# Patient Record
Sex: Male | Born: 1950 | Race: White | Hispanic: No | Marital: Married | Smoking: Never smoker
Health system: Southern US, Community
[De-identification: ages and names within clinical notes are randomized; demographics above are authoritative.]

## PROBLEM LIST (undated history)

## (undated) DIAGNOSIS — N200 Calculus of kidney: Secondary | ICD-10-CM

## (undated) DIAGNOSIS — I10 Essential (primary) hypertension: Secondary | ICD-10-CM

## (undated) HISTORY — PX: PAIN PUMP IMPLANTATION: SHX330

## (undated) HISTORY — PX: ORIF CALCANEOUS FRACTURE: SHX5030

## (undated) HISTORY — PX: JOINT REPLACEMENT: SHX530

## (undated) HISTORY — PX: NASAL RECONSTRUCTION WITH SEPTAL REPAIR: SHX5665

---

## 2015-05-13 ENCOUNTER — Other Ambulatory Visit: Payer: Self-pay | Admitting: Neurosurgery

## 2015-05-13 ENCOUNTER — Other Ambulatory Visit (HOSPITAL_COMMUNITY): Payer: Self-pay | Admitting: Neurosurgery

## 2015-05-13 DIAGNOSIS — M5417 Radiculopathy, lumbosacral region: Secondary | ICD-10-CM

## 2015-05-14 ENCOUNTER — Ambulatory Visit (HOSPITAL_COMMUNITY)
Admission: RE | Admit: 2015-05-14 | Discharge: 2015-05-14 | Disposition: A | Discharge status: Home / Self Care | Payer: BLUE CROSS/BLUE SHIELD | Source: Ambulatory Visit | Attending: Neurosurgery | Admitting: Neurosurgery

## 2015-05-14 DIAGNOSIS — M5126 Other intervertebral disc displacement, lumbar region: Secondary | ICD-10-CM | POA: Insufficient documentation

## 2015-05-14 DIAGNOSIS — M5417 Radiculopathy, lumbosacral region: Secondary | ICD-10-CM

## 2015-05-14 DIAGNOSIS — M5137 Other intervertebral disc degeneration, lumbosacral region: Secondary | ICD-10-CM | POA: Insufficient documentation

## 2015-05-14 DIAGNOSIS — M4806 Spinal stenosis, lumbar region: Secondary | ICD-10-CM | POA: Diagnosis not present

## 2015-05-14 DIAGNOSIS — M47897 Other spondylosis, lumbosacral region: Secondary | ICD-10-CM | POA: Diagnosis not present

## 2015-05-14 DIAGNOSIS — N200 Calculus of kidney: Secondary | ICD-10-CM | POA: Diagnosis not present

## 2015-05-14 MED ORDER — IOHEXOL 300 MG/ML  SOLN
10.0000 mL | Freq: Once | INTRAMUSCULAR | Status: AC | PRN
  Administered 2015-05-14: 10 mL via INTRATHECAL

## 2015-05-14 MED ORDER — HYDROMORPHONE HCL 1 MG/ML IJ SOLN
INTRAMUSCULAR | Status: AC
  Filled 2015-05-14: qty 1

## 2015-05-14 MED ORDER — LIDOCAINE HCL (PF) 1 % IJ SOLN
INTRAMUSCULAR | Status: AC
  Filled 2015-05-14: qty 10

## 2015-05-14 MED ORDER — ONDANSETRON HCL 4 MG/2ML IJ SOLN: 4.0000 mg | Freq: Four times a day (QID) | INTRAMUSCULAR | Status: DC | PRN

## 2015-05-14 MED ORDER — HYDROMORPHONE HCL 1 MG/ML IJ SOLN
1.0000 mg | Freq: Once | INTRAMUSCULAR | Status: AC
Start: 2015-05-14 — End: 2015-05-14
  Administered 2015-05-14: 1 mg via INTRAMUSCULAR

## 2015-05-14 MED ORDER — DIAZEPAM 5 MG PO TABS
ORAL_TABLET | ORAL | Status: AC
  Administered 2015-05-14: 10 mg via ORAL
  Filled 2015-05-14: qty 2

## 2015-05-14 MED ORDER — DIAZEPAM 5 MG PO TABS
10.0000 mg | ORAL_TABLET | Freq: Once | ORAL | Status: AC
  Administered 2015-05-14: 10 mg via ORAL

## 2015-05-14 NOTE — Discharge Instructions (Signed)
Myelogram and Lumbar Puncture Discharge Instructions ° °1. Go home and rest quietly for the next 24 hours.  It is important to lie flat for the next 24 hours.  Get up only to go to the restroom.  You may lie in the bed or on a couch on your back, your stomach, your left side or your right side.  You may have one pillow under your head.  You may have pillows between your knees while you are on your side or under your knees while you are on your back. ° °2. DO NOT drive today.  Recline the seat as far back as it will go, while still wearing your seat belt, on the way home. ° °3. You may get up to go to the bathroom as needed.  You may sit up for 10 minutes to eat.  You may resume your normal diet and medications unless otherwise indicated. ° °4. The incidence of headache, nausea, or vomiting is about 5% (one in 20 patients).  If you develop a headache, lie flat and drink plenty of fluids until the headache goes away.  Caffeinated beverages may be helpful.  If you develop severe nausea and vomiting or a headache that does not go away with flat bed rest, call Dr Stern ° °5. You may resume normal activities after your 24 hours of bed rest is over; however, do not exert yourself strongly or do any heavy lifting tomorrow. ° °6. Call your physician for a follow-up appointment.  The results of your myelogram will be sent directly to your physician by the following day. ° °7. If you have any questions or if complications develop after you arrive home, please call Dr Stern ° °Discharge instructions have been explained to the patient.  The patient, or the person responsible for the patient, fully understands these instructions. ° ° °

## 2015-05-14 NOTE — Procedures (Signed)
Lumbar myelogram performed at L 5 S 1 level with Omnipaque 300, 6 cc

## 2015-05-18 ENCOUNTER — Other Ambulatory Visit: Payer: Self-pay | Admitting: Neurosurgery

## 2015-05-28 ENCOUNTER — Encounter (HOSPITAL_COMMUNITY): Payer: Self-pay

## 2015-05-28 ENCOUNTER — Encounter (HOSPITAL_COMMUNITY)
Admission: RE | Admit: 2015-05-28 | Discharge: 2015-05-28 | Disposition: A | Discharge status: Home / Self Care | Payer: BLUE CROSS/BLUE SHIELD | Source: Ambulatory Visit | Attending: Neurosurgery | Admitting: Neurosurgery

## 2015-05-28 DIAGNOSIS — Z01812 Encounter for preprocedural laboratory examination: Secondary | ICD-10-CM | POA: Diagnosis present

## 2015-05-28 DIAGNOSIS — M4316 Spondylolisthesis, lumbar region: Secondary | ICD-10-CM | POA: Insufficient documentation

## 2015-05-28 DIAGNOSIS — I10 Essential (primary) hypertension: Secondary | ICD-10-CM | POA: Diagnosis not present

## 2015-05-28 DIAGNOSIS — Z01810 Encounter for preprocedural cardiovascular examination: Secondary | ICD-10-CM | POA: Insufficient documentation

## 2015-05-28 DIAGNOSIS — M4806 Spinal stenosis, lumbar region: Secondary | ICD-10-CM | POA: Insufficient documentation

## 2015-05-28 HISTORY — DX: Essential (primary) hypertension: I10

## 2015-05-28 HISTORY — DX: Calculus of kidney: N20.0

## 2015-05-28 LAB — BASIC METABOLIC PANEL
Anion gap: 6 (ref 5–15)
BUN: 16 mg/dL (ref 6–20)
CHLORIDE: 103 mmol/L (ref 101–111)
CO2: 29 mmol/L (ref 22–32)
Calcium: 9.1 mg/dL (ref 8.9–10.3)
Creatinine, Ser: 1.12 mg/dL (ref 0.61–1.24)
GFR calc Af Amer: >60 mL/min (ref >60)
Glucose, Bld: 98 mg/dL (ref 65–99)
Potassium: 3.7 mmol/L (ref 3.5–5.1)
Sodium: 138 mmol/L (ref 135–145)

## 2015-05-28 LAB — CBC
HCT: 35.8 % — ABNORMAL LOW (ref 39.0–52.0)
Hemoglobin: 12.2 g/dL — ABNORMAL LOW (ref 13.0–17.0)
MCH: 32.1 pg (ref 26.0–34.0)
MCHC: 34.1 g/dL (ref 30.0–36.0)
MCV: 94.2 fL (ref 78.0–100.0)
PLATELETS: 237 10*3/uL (ref 150–400)
RBC: 3.8 MIL/uL — AB (ref 4.22–5.81)
RDW: 12.5 % (ref 11.5–15.5)
WBC: 5.7 10*3/uL (ref 4.0–10.5)

## 2015-05-28 LAB — SURGICAL PCR SCREEN
MRSA, PCR: NEGATIVE
Staphylococcus aureus: NEGATIVE

## 2015-05-28 NOTE — Pre-Procedure Instructions (Signed)
Bryker Madia  05/28/2015      [facility] - [place], [place] - [address] [place] [address] Phone: [phone] Fax: [phone]    Your procedure is scheduled on July 1  Report to [facility] Admitting at 715 A.M.  Call this number if you have problems the morning of surgery:  [phone]   Remember:  Do not eat food or drink liquids after midnight.  Take these medicines the morning of surgery with A SIP OF WATER Citalopram (Celexa)  Stop taking Aspirin, Ibuprofen, Aleve, BC's, Goody's, Herbal medication, Fish Oil   Do not wear jewelry, make-up or nail polish.  Do not wear lotions, powders, or perfumes.  You may wear deodorant.  Do not shave 48 hours prior to surgery.  Men may shave face and neck.  Do not bring valuables to the hospital.  [facility] is not responsible for any belongings or valuables.  Contacts, dentures or bridgework may not be worn into surgery.  Leave your suitcase in the car.  After surgery it may be brought to your room.  For patients admitted to the hospital, discharge time will be determined by your treatment team.  Patients discharged the day of surgery will not be allowed to drive home.    Special instructions:  [facility] - Preparing for Surgery  Before surgery, you can play an important role.  Because skin is not sterile, your skin needs to be as free of germs as possible.  You can reduce the number of germs on you skin by washing with CHG (chlorahexidine gluconate) soap before surgery.  CHG is an antiseptic cleaner which kills germs and bonds with the skin to continue killing germs even after washing.  Please DO NOT use if you have an allergy to CHG or antibacterial soaps.  If your skin becomes reddened/irritated stop using the CHG and inform your nurse when you arrive at Short Stay.  Do not shave (including legs and underarms) for at least 48 hours prior to the first CHG  shower.  You may shave your face.  Please follow these instructions carefully:   1.  Shower with CHG Soap the night before surgery and the    morning of Surgery.  2.  If you choose to wash your hair, wash your hair first as usual with your normal shampoo.  3.  After you shampoo, rinse your hair and body thoroughly to remove the   Shampoo.  4.  Use CHG as you would any other liquid soap.  You can apply chg directly       to the skin and wash gently with scrungie or a clean washcloth.  5.  Apply the CHG Soap to your body ONLY FROM THE NECK DOWN.    Do not use on open wounds or open sores.  Avoid contact with your eyes,   ears, mouth and genitals (private parts).  Wash genitals (private parts) with your normal soap.  6.  Wash thoroughly, paying special attention to the area where your surgery  will be performed.  7.  Thoroughly rinse your body with warm water from the neck down.  8.  DO NOT shower/wash with your normal soap after using and rinsing off  the CHG Soap.  9.  Pat yourself dry with a clean towel.            10.  Wear clean pajamas.  11.  Place clean sheets on your bed the night of your first shower and do not sleep with pets.  Day of Surgery  Do not apply any lotions/deoderants the morning of surgery.  Please wear clean clothes to the hospital/surgery center.     Please read over the following fact sheets that you were given. Pain Booklet, Coughing and Deep Breathing, MRSA Information and Surgical Site Infection Prevention

## 2015-05-28 NOTE — Progress Notes (Signed)
PCP is Dr Donnetta Hutching Denies seeing a cardiologist. Denies ever having a stress test, echo, or card cath.

## 2015-06-04 MED ORDER — CEFAZOLIN SODIUM-DEXTROSE 2-3 GM-% IV SOLR
2.0000 g | INTRAVENOUS | Status: AC
  Administered 2015-06-05: 2 g via INTRAVENOUS
  Filled 2015-06-04: qty 50

## 2015-06-04 NOTE — Progress Notes (Signed)
Confirmed with patient that Dr. Fredrich BirksStern's office had made him aware of time change for surgery.  Patient stated that he was aware that [facility]30 [facility].

## 2015-06-04 NOTE — Discharge Instructions (Signed)

## 2015-06-04 NOTE — H&P (Signed)
Patient ID:   [id number] Patient: Mason Horton  Date of Birth: [birth year] Visit Type: Chart Update   Date: 05/18/2015 10:00 AM Provider: Danae Orleans. Venetia Maxon MD   This 64 year old male presents for Leg pain.  History of Present Illness: 1.  Leg pain  The patient returns to review his lumbar myelogram and post myelographic CT scan which shows that he has severe spinal stenosis at L3 L4 and L4 L5 levels.  The patient's leg pain is likely a result of this.  He does have spondylolisthesis of L4 on L5 but this is not terribly severe and does not change with flexion and extension views of his lumbar spine which were obtained in the office today.  In light of his significant structural abnormalities have recommended proceeding with surgery.  He has severe and unrelenting pain into both his legs.  I called the [employer]  representative to discuss the issues of managing his intrathecal catheter which enters the lumbar spine at the L3 L4 level which is the site of planned surgery.  The options would be to remove this catheter which I think would be unfavorable as it will require repair CSF leak through a large to the needle hole in the dura versus avoiding contact with the catheter versus tying off the catheter and putting a new catheter in multilevel.  He does not feel that he needs the spinal cord stimulator but he does need the interim thecal pump and wants to have this continuing into the future.  Plan Robbi Garter be to proceed with lumbar decompression without fusion L3 through L5 levels in early July.  Risks and benefits were discussed in detail with the patient and he wishes to proceed.  We went over teaching materials as well.        PAST MEDICAL/SURGICAL HISTORY   (Detailed)  Disease/disorder Onset Date Management Date Comments    Spinal cord stimulator 2010     Left knee surgery 1993     Right knee surgery 2008   Anxiety      Hypertension         PAST MEDICAL HISTORY, SURGICAL HISTORY,  FAMILY HISTORY, SOCIAL HISTORY AND REVIEW OF SYSTEMS I have reviewed the patient's past medical, surgical, family and social history as well as the comprehensive review of systems as included on the [facility] history form dated 05/13/2015, which I have signed.  Family History  (Detailed) Relationship Family Member Name Deceased Age at Death Condition Onset Age Cause of Death      Family history of Hypertension  N      Family history of Cancer, prostate  N      Family history of Diabetes mellitus  N  Father    Cancer, brain  N    SOCIAL HISTORY  (Detailed) Tobacco use reviewed. Preferred language is Unknown.   Smoking status: Never smoker.  SMOKING STATUS Use Status Type Smoking Status Usage Per Day Years Used Total Pack Years  no/never  Never smoker             MEDICATIONS(added, continued or stopped this visit): Started Medication Directions Instruction Stopped   citalopram 40 mg tablet take 1 tablet by oral route  every day     lisinopril 20 mg tablet take 1 tablet by oral route  every day    05/18/2015 Norco 5 mg-325 mg tablet take 1 tablet by oral route  every 6 hours as needed for pain    05/18/2015 tizanidine 4  mg tablet take 1 tablet by oral route  up to TID prn spasm       ALLERGIES: Ingredient Reaction Medication Name Comment  NO KNOWN ALLERGIES     No known allergies. Reviewed, no changes.    Vitals Date Temp F BP Pulse Ht In Wt Lb BMI BSA Pain Score  05/18/2015  120/77 73 71 207 28.87  5/10      IMPRESSION Severe lumbar spinal stenosis with severe lower extremity radicular pain with intrathecal pump and spinal cord stimulator.  Completed Orders (this encounter) Order Details Reason Side Interpretation Result Initial Treatment Date Region  Lumbar Spine- AP/Lat/Flex/Ex      05/18/2015   Lifestyle education regarding diet Encouraged to eat a well balanced diet and follow up with primary care physician.          Assessment/Plan # Detail Type Description   1. Assessment Spinal stenosis of lumbar region (M48.06).       2. Assessment Spondylolisthesis, lumbar region (M43.16).       3. Assessment Low back pain, unspecified back pain laterality, with sciatica presence unspecified (M54.5).       4. Assessment Radiculopathy, lumbar region (M54.16).       5. Assessment Body mass index (BMI) 28.0-28.9, adult (Z61.09(Z68.28).   Plan Orders Today's instructions / counseling include(s) Lifestyle education regarding diet.         Pain Assessment/Treatment Pain Scale: 5/10. Method: Numeric Pain Intensity Scale. Location: leg. Onset: 09/06/2014. Duration: varies. Quality: discomforting. Pain Assessment/Treatment follow-up plan of care: Patient is taking medications as prescribed..  Decompression L3 through L5 levels with preservation of pump and stimulator as possible.  Orders: Diagnostic Procedures: Assessment Procedure  M48.06 Laminectomy - L3 - L4 - L5  M54.16 Lumbar Spine- AP/Lat/Flex/Ex  Instruction(s)/Education: Assessment Instruction  [phone] Lifestyle education regarding diet    MEDICATIONS PRESCRIBED TODAY    Rx Quantity Refills  TIZANIDINE HCL 4 mg  60 0  NORCO 5 mg-325 mg  60 0            Provider:  Danae OrleansJoseph D. Venetia MaxonStern MD  05/18/2015 05:14 PM Dictation edited by: Danae OrleansJoseph D. Venetia MaxonStern    CC Providers: Maeola HarmanJoseph Sonali Wivell MD [address] [place], [place] [id number]              Electronically signed by Danae OrleansJoseph D. Venetia MaxonStern MD on 05/18/2015 05:14 PM

## 2015-06-05 ENCOUNTER — Inpatient Hospital Stay (HOSPITAL_COMMUNITY)
Admission: RE | Admit: 2015-06-05 | Discharge: 2015-06-06 | DRG: 517 | Disposition: A | Discharge status: Home / Self Care | Payer: BLUE CROSS/BLUE SHIELD | Source: Ambulatory Visit | Attending: Neurosurgery | Admitting: Neurosurgery

## 2015-06-05 ENCOUNTER — Ambulatory Visit (HOSPITAL_COMMUNITY): Payer: BLUE CROSS/BLUE SHIELD | Admitting: Anesthesiology

## 2015-06-05 ENCOUNTER — Ambulatory Visit (HOSPITAL_COMMUNITY): Payer: BLUE CROSS/BLUE SHIELD

## 2015-06-05 ENCOUNTER — Encounter (HOSPITAL_COMMUNITY)
Admission: RE | Disposition: A | Discharge status: Home / Self Care | Payer: Self-pay | Source: Ambulatory Visit | Attending: Neurosurgery

## 2015-06-05 ENCOUNTER — Encounter (HOSPITAL_COMMUNITY): Payer: Self-pay | Admitting: *Deleted

## 2015-06-05 DIAGNOSIS — Z8249 Family history of ischemic heart disease and other diseases of the circulatory system: Secondary | ICD-10-CM

## 2015-06-05 DIAGNOSIS — M48062 Spinal stenosis, lumbar region with neurogenic claudication: Secondary | ICD-10-CM | POA: Diagnosis present

## 2015-06-05 DIAGNOSIS — F419 Anxiety disorder, unspecified: Secondary | ICD-10-CM | POA: Diagnosis present

## 2015-06-05 DIAGNOSIS — I10 Essential (primary) hypertension: Secondary | ICD-10-CM | POA: Diagnosis present

## 2015-06-05 DIAGNOSIS — Z9689 Presence of other specified functional implants: Secondary | ICD-10-CM

## 2015-06-05 DIAGNOSIS — Z79891 Long term (current) use of opiate analgesic: Secondary | ICD-10-CM

## 2015-06-05 DIAGNOSIS — Z419 Encounter for procedure for purposes other than remedying health state, unspecified: Secondary | ICD-10-CM

## 2015-06-05 DIAGNOSIS — M4806 Spinal stenosis, lumbar region: Principal | ICD-10-CM | POA: Diagnosis present

## 2015-06-05 HISTORY — PX: LUMBAR LAMINECTOMY/DECOMPRESSION MICRODISCECTOMY: SHX5026

## 2015-06-05 SURGERY — LUMBAR LAMINECTOMY/DECOMPRESSION MICRODISCECTOMY 2 LEVELS
Anesthesia: General | Site: Back

## 2015-06-05 MED ORDER — KETAMINE HCL 100 MG/ML IJ SOLN
INTRAMUSCULAR | Status: AC
  Administered 2015-06-05: 56 mg via INTRAVENOUS
  Filled 2015-06-05: qty 1

## 2015-06-05 MED ORDER — PHENYLEPHRINE 40 MCG/ML (10ML) SYRINGE FOR IV PUSH (FOR BLOOD PRESSURE SUPPORT)
PREFILLED_SYRINGE | INTRAVENOUS | Status: AC
  Filled 2015-06-05: qty 10

## 2015-06-05 MED ORDER — BISACODYL 10 MG RE SUPP: 10.0000 mg | Freq: Every day | RECTAL | Status: DC | PRN

## 2015-06-05 MED ORDER — NEOSTIGMINE METHYLSULFATE 10 MG/10ML IV SOLN
INTRAVENOUS | Status: DC | PRN
  Administered 2015-06-05: 3 mg via INTRAVENOUS

## 2015-06-05 MED ORDER — LIDOCAINE-EPINEPHRINE 1 %-1:100000 IJ SOLN
INTRAMUSCULAR | Status: DC | PRN
  Administered 2015-06-05: 1 mL
  Administered 2015-06-05: 9 mL

## 2015-06-05 MED ORDER — EPHEDRINE SULFATE 50 MG/ML IJ SOLN
INTRAMUSCULAR | Status: DC | PRN
  Administered 2015-06-05: 15 mg via INTRAVENOUS
  Administered 2015-06-05: 10 mg via INTRAVENOUS

## 2015-06-05 MED ORDER — ONDANSETRON HCL 4 MG/2ML IJ SOLN
INTRAMUSCULAR | Status: AC
  Filled 2015-06-05: qty 2

## 2015-06-05 MED ORDER — SODIUM CHLORIDE 0.9 % IJ SOLN
3.0000 mL | Freq: Two times a day (BID) | INTRAMUSCULAR | Status: DC
  Administered 2015-06-05: 3 mL via INTRAVENOUS

## 2015-06-05 MED ORDER — PROMETHAZINE HCL 25 MG/ML IJ SOLN: 6.2500 mg | INTRAMUSCULAR | Status: DC | PRN

## 2015-06-05 MED ORDER — THROMBIN 5000 UNITS EX SOLR
CUTANEOUS | Status: DC | PRN
  Administered 2015-06-05 (×2): 5000 [IU] via TOPICAL

## 2015-06-05 MED ORDER — MENTHOL 3 MG MT LOZG: 1.0000 | LOZENGE | OROMUCOSAL | Status: DC | PRN

## 2015-06-05 MED ORDER — HYDROCODONE-ACETAMINOPHEN 5-325 MG PO TABS
1.0000 | ORAL_TABLET | ORAL | Status: DC | PRN
  Administered 2015-06-05: 2 via ORAL
  Filled 2015-06-05: qty 2

## 2015-06-05 MED ORDER — SUCCINYLCHOLINE CHLORIDE 20 MG/ML IJ SOLN
INTRAMUSCULAR | Status: AC
  Filled 2015-06-05: qty 1

## 2015-06-05 MED ORDER — BUPIVACAINE HCL (PF) 0.5 % IJ SOLN
INTRAMUSCULAR | Status: DC | PRN
Start: 2015-06-05 — End: 2015-06-05
  Administered 2015-06-05: 1 mL
  Administered 2015-06-05: 9 mL

## 2015-06-05 MED ORDER — ONDANSETRON HCL 4 MG/2ML IJ SOLN
INTRAMUSCULAR | Status: DC | PRN
  Administered 2015-06-05: 4 mg via INTRAVENOUS

## 2015-06-05 MED ORDER — NEOSTIGMINE METHYLSULFATE 10 MG/10ML IV SOLN
INTRAVENOUS | Status: AC
  Filled 2015-06-05: qty 1

## 2015-06-05 MED ORDER — FENTANYL CITRATE (PF) 250 MCG/5ML IJ SOLN
INTRAMUSCULAR | Status: DC | PRN
  Administered 2015-06-05: 100 ug via INTRAVENOUS
  Administered 2015-06-05: 50 ug via INTRAVENOUS

## 2015-06-05 MED ORDER — FERROUS SULFATE 325 (65 FE) MG PO TABS
325.0000 mg | ORAL_TABLET | Freq: Every day | ORAL | Status: DC
  Administered 2015-06-06: 325 mg via ORAL
  Filled 2015-06-05 (×2): qty 1

## 2015-06-05 MED ORDER — MIDAZOLAM HCL 2 MG/2ML IJ SOLN
INTRAMUSCULAR | Status: AC
  Filled 2015-06-05: qty 2

## 2015-06-05 MED ORDER — FLEET ENEMA 7-19 GM/118ML RE ENEM
1.0000 | ENEMA | Freq: Once | RECTAL | Status: AC | PRN
  Filled 2015-06-05: qty 1

## 2015-06-05 MED ORDER — SODIUM CHLORIDE 0.9 % IV SOLN
500.0000 mg | INTRAVENOUS | Status: DC | PRN
  Administered 2015-06-05: 10 ug/kg/min via INTRAVENOUS

## 2015-06-05 MED ORDER — SODIUM CHLORIDE 0.9 % IJ SOLN
INTRAMUSCULAR | Status: AC
  Filled 2015-06-05: qty 10

## 2015-06-05 MED ORDER — OXYCODONE-ACETAMINOPHEN 5-325 MG PO TABS
ORAL_TABLET | ORAL | Status: AC
  Filled 2015-06-05: qty 2

## 2015-06-05 MED ORDER — HYDROMORPHONE HCL 1 MG/ML IJ SOLN
0.5000 mg | INTRAMUSCULAR | Status: DC | PRN
  Administered 2015-06-05 – 2015-06-06 (×4): 1 mg via INTRAVENOUS
  Filled 2015-06-05 (×4): qty 1

## 2015-06-05 MED ORDER — METHOCARBAMOL 1000 MG/10ML IJ SOLN
500.0000 mg | Freq: Four times a day (QID) | INTRAMUSCULAR | Status: DC | PRN
  Filled 2015-06-05: qty 5

## 2015-06-05 MED ORDER — GLYCOPYRROLATE 0.2 MG/ML IJ SOLN
INTRAMUSCULAR | Status: AC
  Filled 2015-06-05: qty 2

## 2015-06-05 MED ORDER — PANTOPRAZOLE SODIUM 40 MG PO TBEC
40.0000 mg | DELAYED_RELEASE_TABLET | Freq: Every day | ORAL | Status: DC
  Administered 2015-06-05: 40 mg via ORAL
  Filled 2015-06-05: qty 1

## 2015-06-05 MED ORDER — LIDOCAINE HCL (CARDIAC) 20 MG/ML IV SOLN
INTRAVENOUS | Status: DC | PRN
  Administered 2015-06-05: 60 mg via INTRAVENOUS

## 2015-06-05 MED ORDER — LACTATED RINGERS IV SOLN
INTRAVENOUS | Status: DC | PRN
  Administered 2015-06-05 (×2): via INTRAVENOUS

## 2015-06-05 MED ORDER — FENTANYL CITRATE (PF) 250 MCG/5ML IJ SOLN
INTRAMUSCULAR | Status: AC
  Filled 2015-06-05: qty 5

## 2015-06-05 MED ORDER — CITALOPRAM HYDROBROMIDE 20 MG PO TABS
20.0000 mg | ORAL_TABLET | Freq: Every day | ORAL | Status: DC
  Filled 2015-06-05: qty 1

## 2015-06-05 MED ORDER — PANTOPRAZOLE SODIUM 40 MG IV SOLR
40.0000 mg | Freq: Every day | INTRAVENOUS | Status: DC
  Filled 2015-06-05: qty 40

## 2015-06-05 MED ORDER — HYDROMORPHONE HCL 1 MG/ML IJ SOLN
0.2500 mg | INTRAMUSCULAR | Status: DC | PRN
  Administered 2015-06-05 (×4): 0.5 mg via INTRAVENOUS

## 2015-06-05 MED ORDER — ALUM & MAG HYDROXIDE-SIMETH 200-200-20 MG/5ML PO SUSP: 30.0000 mL | Freq: Four times a day (QID) | ORAL | Status: DC | PRN

## 2015-06-05 MED ORDER — VITAMIN B-1 50 MG PO TABS
50.0000 mg | ORAL_TABLET | Freq: Every day | ORAL | Status: DC
  Filled 2015-06-05 (×2): qty 1

## 2015-06-05 MED ORDER — PROPOFOL 10 MG/ML IV BOLUS
INTRAVENOUS | Status: DC | PRN
  Administered 2015-06-05: 140 mg via INTRAVENOUS
  Administered 2015-06-05: 40 mg via INTRAVENOUS

## 2015-06-05 MED ORDER — HYDROMORPHONE HCL 1 MG/ML IJ SOLN
INTRAMUSCULAR | Status: AC
  Administered 2015-06-05: 0.5 mg via INTRAVENOUS
  Filled 2015-06-05: qty 2

## 2015-06-05 MED ORDER — IRON PO TABS: 1.0000 | ORAL_TABLET | Freq: Every day | ORAL | Status: DC

## 2015-06-05 MED ORDER — THROMBIN 5000 UNITS EX SOLR
OROMUCOSAL | Status: DC | PRN
  Administered 2015-06-05: 09:00:00 via TOPICAL

## 2015-06-05 MED ORDER — ACETAMINOPHEN 325 MG PO TABS: 650.0000 mg | ORAL_TABLET | ORAL | Status: DC | PRN

## 2015-06-05 MED ORDER — OXYCODONE-ACETAMINOPHEN 5-325 MG PO TABS
1.0000 | ORAL_TABLET | ORAL | Status: DC | PRN
  Administered 2015-06-05 – 2015-06-06 (×4): 2 via ORAL
  Filled 2015-06-05 (×3): qty 2

## 2015-06-05 MED ORDER — PROPOFOL 10 MG/ML IV BOLUS
INTRAVENOUS | Status: AC
  Filled 2015-06-05: qty 20

## 2015-06-05 MED ORDER — METHOCARBAMOL 500 MG PO TABS
500.0000 mg | ORAL_TABLET | Freq: Four times a day (QID) | ORAL | Status: DC | PRN
  Administered 2015-06-05 – 2015-06-06 (×2): 500 mg via ORAL
  Filled 2015-06-05: qty 1

## 2015-06-05 MED ORDER — SODIUM CHLORIDE 0.9 % IJ SOLN: 3.0000 mL | INTRAMUSCULAR | Status: DC | PRN

## 2015-06-05 MED ORDER — ROCURONIUM BROMIDE 100 MG/10ML IV SOLN
INTRAVENOUS | Status: DC | PRN
  Administered 2015-06-05: 40 mg via INTRAVENOUS

## 2015-06-05 MED ORDER — POLYETHYLENE GLYCOL 3350 17 G PO PACK
17.0000 g | PACK | Freq: Every day | ORAL | Status: DC | PRN
  Filled 2015-06-05: qty 1

## 2015-06-05 MED ORDER — EPHEDRINE SULFATE 50 MG/ML IJ SOLN
INTRAMUSCULAR | Status: AC
  Filled 2015-06-05: qty 1

## 2015-06-05 MED ORDER — PHENOL 1.4 % MT LIQD: 1.0000 | OROMUCOSAL | Status: DC | PRN

## 2015-06-05 MED ORDER — METHOCARBAMOL 500 MG PO TABS
ORAL_TABLET | ORAL | Status: AC
  Filled 2015-06-05: qty 1

## 2015-06-05 MED ORDER — ROCURONIUM BROMIDE 50 MG/5ML IV SOLN
INTRAVENOUS | Status: AC
  Filled 2015-06-05: qty 1

## 2015-06-05 MED ORDER — ZOLPIDEM TARTRATE 5 MG PO TABS
10.0000 mg | ORAL_TABLET | Freq: Every evening | ORAL | Status: DC | PRN
  Administered 2015-06-05: 10 mg via ORAL
  Filled 2015-06-05: qty 2

## 2015-06-05 MED ORDER — CEFAZOLIN SODIUM-DEXTROSE 2-3 GM-% IV SOLR
2.0000 g | Freq: Three times a day (TID) | INTRAVENOUS | Status: AC
  Administered 2015-06-05 (×2): 2 g via INTRAVENOUS
  Filled 2015-06-05 (×2): qty 50

## 2015-06-05 MED ORDER — HEMOSTATIC AGENTS (NO CHARGE) OPTIME
TOPICAL | Status: DC | PRN
  Administered 2015-06-05: 1 via TOPICAL

## 2015-06-05 MED ORDER — LIDOCAINE HCL (CARDIAC) 20 MG/ML IV SOLN
INTRAVENOUS | Status: AC
  Filled 2015-06-05: qty 5

## 2015-06-05 MED ORDER — GLYCOPYRROLATE 0.2 MG/ML IJ SOLN
INTRAMUSCULAR | Status: DC | PRN
  Administered 2015-06-05: 0.4 mg via INTRAVENOUS

## 2015-06-05 MED ORDER — GLYCOPYRROLATE 0.2 MG/ML IJ SOLN
INTRAMUSCULAR | Status: AC
  Filled 2015-06-05: qty 1

## 2015-06-05 MED ORDER — 0.9 % SODIUM CHLORIDE (POUR BTL) OPTIME
TOPICAL | Status: DC | PRN
  Administered 2015-06-05: 1000 mL

## 2015-06-05 MED ORDER — ONDANSETRON HCL 4 MG/2ML IJ SOLN: 4.0000 mg | INTRAMUSCULAR | Status: DC | PRN

## 2015-06-05 MED ORDER — KCL IN DEXTROSE-NACL 20-5-0.45 MEQ/L-%-% IV SOLN
INTRAVENOUS | Status: DC
  Filled 2015-06-05 (×3): qty 1000

## 2015-06-05 MED ORDER — DOCUSATE SODIUM 100 MG PO CAPS
100.0000 mg | ORAL_CAPSULE | Freq: Two times a day (BID) | ORAL | Status: DC
  Administered 2015-06-05: 100 mg via ORAL
  Filled 2015-06-05: qty 1

## 2015-06-05 MED ORDER — MIDAZOLAM HCL 5 MG/5ML IJ SOLN
INTRAMUSCULAR | Status: DC | PRN
  Administered 2015-06-05: 2 mg via INTRAVENOUS

## 2015-06-05 MED ORDER — LISINOPRIL 10 MG PO TABS
10.0000 mg | ORAL_TABLET | Freq: Every day | ORAL | Status: DC
  Filled 2015-06-05 (×2): qty 1

## 2015-06-05 MED ORDER — ACETAMINOPHEN 650 MG RE SUPP: 650.0000 mg | RECTAL | Status: DC | PRN

## 2015-06-05 SURGICAL SUPPLY — 62 items
BENZOIN TINCTURE PRP APPL 2/3 (GAUZE/BANDAGES/DRESSINGS) IMPLANT
BIT DRILL NEURO 2X3.1 SFT TUCH (MISCELLANEOUS) ×1 IMPLANT
BLADE CLIPPER SURG (BLADE) ×2 IMPLANT
BUR ROUND FLUTED 5 RND (BURR) ×2 IMPLANT
CANISTER SUCT 3000ML PPV (MISCELLANEOUS) ×2 IMPLANT
CONT SPEC 4OZ CLIKSEAL STRL BL (MISCELLANEOUS) IMPLANT
DECANTER SPIKE VIAL GLASS SM (MISCELLANEOUS) ×2 IMPLANT
DERMABOND ADHESIVE PROPEN (GAUZE/BANDAGES/DRESSINGS) ×1
DERMABOND ADVANCED (GAUZE/BANDAGES/DRESSINGS)
DERMABOND ADVANCED .7 DNX12 (GAUZE/BANDAGES/DRESSINGS) IMPLANT
DERMABOND ADVANCED .7 DNX6 (GAUZE/BANDAGES/DRESSINGS) ×1 IMPLANT
DRAPE LAPAROTOMY 100X72X124 (DRAPES) ×2 IMPLANT
DRAPE MICROSCOPE LEICA (MISCELLANEOUS) ×2 IMPLANT
DRAPE POUCH INSTRU U-SHP 10X18 (DRAPES) ×2 IMPLANT
DRAPE SURG 17X23 STRL (DRAPES) ×2 IMPLANT
DRILL NEURO 2X3.1 SOFT TOUCH (MISCELLANEOUS) ×2
DRSG OPSITE POSTOP 4X6 (GAUZE/BANDAGES/DRESSINGS) ×2 IMPLANT
DRSG TELFA 3X8 NADH (GAUZE/BANDAGES/DRESSINGS) IMPLANT
DURAPREP 26ML APPLICATOR (WOUND CARE) ×2 IMPLANT
ELECT BLADE 4.0 EZ CLEAN MEGAD (MISCELLANEOUS) ×2
ELECT REM PT RETURN 9FT ADLT (ELECTROSURGICAL) ×2
ELECTRODE BLDE 4.0 EZ CLN MEGD (MISCELLANEOUS) ×1 IMPLANT
ELECTRODE REM PT RTRN 9FT ADLT (ELECTROSURGICAL) ×1 IMPLANT
GAUZE SPONGE 4X4 12PLY STRL (GAUZE/BANDAGES/DRESSINGS) IMPLANT
GAUZE SPONGE 4X4 16PLY XRAY LF (GAUZE/BANDAGES/DRESSINGS) IMPLANT
GLOVE BIO SURGEON STRL SZ8 (GLOVE) ×2 IMPLANT
GLOVE BIOGEL PI IND STRL 8 (GLOVE) ×3 IMPLANT
GLOVE BIOGEL PI IND STRL 8.5 (GLOVE) ×1 IMPLANT
GLOVE BIOGEL PI INDICATOR 8 (GLOVE) ×3
GLOVE BIOGEL PI INDICATOR 8.5 (GLOVE) ×1
GLOVE ECLIPSE 7.5 STRL STRAW (GLOVE) ×8 IMPLANT
GLOVE ECLIPSE 8.0 STRL XLNG CF (GLOVE) ×2 IMPLANT
GLOVE EXAM NITRILE LRG STRL (GLOVE) IMPLANT
GLOVE EXAM NITRILE MD LF STRL (GLOVE) IMPLANT
GLOVE EXAM NITRILE XL STR (GLOVE) IMPLANT
GLOVE EXAM NITRILE XS STR PU (GLOVE) IMPLANT
GOWN STRL REUS W/ TWL LRG LVL3 (GOWN DISPOSABLE) IMPLANT
GOWN STRL REUS W/ TWL XL LVL3 (GOWN DISPOSABLE) ×2 IMPLANT
GOWN STRL REUS W/TWL 2XL LVL3 (GOWN DISPOSABLE) ×4 IMPLANT
GOWN STRL REUS W/TWL LRG LVL3 (GOWN DISPOSABLE)
GOWN STRL REUS W/TWL XL LVL3 (GOWN DISPOSABLE) ×2
HEMOSTAT POWDER KIT SURGIFOAM (HEMOSTASIS) ×2 IMPLANT
KIT BASIN OR (CUSTOM PROCEDURE TRAY) ×2 IMPLANT
KIT ROOM TURNOVER OR (KITS) ×2 IMPLANT
NEEDLE HYPO 18GX1.5 BLUNT FILL (NEEDLE) IMPLANT
NEEDLE HYPO 25X1 1.5 SAFETY (NEEDLE) ×2 IMPLANT
NS IRRIG 1000ML POUR BTL (IV SOLUTION) ×2 IMPLANT
PACK LAMINECTOMY NEURO (CUSTOM PROCEDURE TRAY) ×2 IMPLANT
PAD ARMBOARD 7.5X6 YLW CONV (MISCELLANEOUS) ×6 IMPLANT
RUBBERBAND STERILE (MISCELLANEOUS) ×4 IMPLANT
SPONGE SURGIFOAM ABS GEL SZ50 (HEMOSTASIS) ×2 IMPLANT
STAPLER SKIN PROX WIDE 3.9 (STAPLE) IMPLANT
STRIP CLOSURE SKIN 1/2X4 (GAUZE/BANDAGES/DRESSINGS) IMPLANT
SUT VIC AB 0 CT1 18XCR BRD8 (SUTURE) ×1 IMPLANT
SUT VIC AB 0 CT1 8-18 (SUTURE) ×1
SUT VIC AB 2-0 CT1 18 (SUTURE) ×2 IMPLANT
SUT VIC AB 3-0 SH 8-18 (SUTURE) ×2 IMPLANT
SYR 20ML ECCENTRIC (SYRINGE) IMPLANT
SYR 5ML LL (SYRINGE) IMPLANT
TOWEL OR 17X24 6PK STRL BLUE (TOWEL DISPOSABLE) ×2 IMPLANT
TOWEL OR 17X26 10 PK STRL BLUE (TOWEL DISPOSABLE) ×2 IMPLANT
WATER STERILE IRR 1000ML POUR (IV SOLUTION) ×2 IMPLANT

## 2015-06-05 NOTE — Evaluation (Signed)
Physical Therapy Evaluation Patient Details Name: Mason Horton MRN: [id number] DOB: 1951/04/02 Today's Date: 06/05/2015   History of Present Illness  Patient is a 64 y/o male s/pL3-4 laminectomy. PMH includes HTN, kidney stones.   Clinical Impression  Patient presents with pain s/p above surgery. Balance improved with increased distance. Encouraged ambulation daily with RN. Anticipate balance and mobility will improve with continued ambulation. All education completed. Pt eager to return home. Pt concerned about having a kidney stone (hx of them). Pt will have assist from wife at home. Pt does not require skilled therapy services as pt functioning at S level of assist. Discharge from therapy.     Follow Up Recommendations No PT follow up;Supervision - Intermittent    Equipment Recommendations  None recommended by PT    Recommendations for Other Services       Precautions / Restrictions Precautions Precautions: Back Precaution Booklet Issued: No Precaution Comments: Reviewed back precautions.  Restrictions Weight Bearing Restrictions: No      Mobility  Bed Mobility               General bed mobility comments: Pt standing in room upon PT arrival. Reviewed bed mobility (log roll) however pt declined performing secondary to having pain relief standing up.   Transfers Overall transfer level: Modified independent   Transfers: Sit to/from Stand Sit to Stand: Modified independent (Device/Increase time)            Ambulation/Gait Ambulation/Gait assistance: Supervision Ambulation Distance (Feet): 300 Feet Assistive device: None Gait Pattern/deviations: Step-through pattern;Decreased stride length;Drifts right/left   Gait velocity interpretation: Below normal speed for age/gender General Gait Details: Pt with mostly steady gait with a few instances of drifting but no overt LOB.   Stairs            Wheelchair Mobility    Modified Rankin (Stroke Patients  Only)       Balance Overall balance assessment: Needs assistance         Standing balance support: During functional activity Standing balance-Leahy Scale: Fair                               Pertinent Vitals/Pain Pain Assessment: Faces Faces Pain Scale: Hurts little more Pain Location: left flank- reports it feels like a kidney stone Pain Descriptors / Indicators: Sore Pain Intervention(s): Monitored during session;Repositioned    Home Living Family/patient expects to be discharged to:: Private residence Living Arrangements: Spouse/significant other Available Help at Discharge: Family Type of Home: House Home Access: Level entry     Home Layout: Two level;Able to live on main level with bedroom/bathroom Home Equipment: None      Prior Function Level of Independence: Independent               Hand Dominance        Extremity/Trunk Assessment   Upper Extremity Assessment: Defer to OT evaluation           Lower Extremity Assessment: Overall WFL for tasks assessed         Communication   Communication: No difficulties  Cognition Arousal/Alertness: Awake/alert Behavior During Therapy: WFL for tasks assessed/performed Overall Cognitive Status: Within Functional Limits for tasks assessed                      General Comments      Exercises        Assessment/Plan    PT Assessment  Patent does not need any further PT services  PT Diagnosis Acute pain;Difficulty walking   PT Problem List    PT Treatment Interventions     PT Goals (Current goals can be found in the Care Plan section) Acute Rehab PT Goals PT Goal Formulation: All assessment and education complete, DC therapy Time For Goal Achievement: 06/19/15 Potential to Achieve Goals: Good    Frequency     Barriers to discharge        Co-evaluation               End of Session Equipment Utilized During Treatment: Gait belt Activity Tolerance: Patient  tolerated treatment well Patient left: Other (comment) (standing in room for pain relief) Nurse Communication: Mobility status         Time: [id number] PT Time Calculation (min) (ACUTE ONLY): 12 min   Charges:   PT Evaluation $Initial PT Evaluation Tier I: 1 Procedure     PT G Codes:        Qiara Minetti A England Greb 06/05/2015, 3:57 PM Mylo RedShauna Samar Dass, PT, DPT [phone]

## 2015-06-05 NOTE — Transfer of Care (Signed)
Immediate Anesthesia Transfer of Care Note  Patient: Mason Horton  Procedure(s) Performed: Procedure(s) with comments: Lumbar three to Lumbar five Laminectomy (N/A) - L3 to L5 Laminectomy  Patient Location: PACU  Anesthesia Type:General  Level of Consciousness: awake, alert , oriented and patient cooperative  Airway & Oxygen Therapy: Patient Spontanous Breathing and Patient connected to nasal cannula oxygen  Post-op Assessment: Report given to RN, Post -op Vital signs reviewed and stable and Patient moving all extremities  Post vital signs: Reviewed and stable  Last Vitals:  Filed Vitals:   06/05/15 1030  BP: 139/74  Pulse: 95  Temp:   Resp: 18    Complications: No apparent anesthesia complications

## 2015-06-05 NOTE — Op Note (Signed)
06/05/2015  10:10 AM  PATIENT:  Mason Horton  64 y.o. male  PRE-OPERATIVE DIAGNOSIS:  Spinal stenosis of lumbar region; Spondylolisthesis, Lumbar region; Low back pain, Unspecified; Radiculopathy, Lumbar region with implanted intrathecal dilaudid pump and catheter  POST-OPERATIVE DIAGNOSIS:  Spinal stenosis of lumbar region; Spondylolisthesis, Lumbar region; Low back pain, Unspecified; Radiculopathy, Lumbar region with implanted intrathecal dilaudid pump and catheter  PROCEDURE:  Procedure(s) with comments: Lumbar three to Lumbar five Laminectomy (N/A) - L3 to L5 Laminectomy for spinal stenosis with avoidance of intrathecal catheter  SURGEON:  Surgeon(s) and Role:    * Maeola HarmanJoseph Maguadalupe Lata, MD - Primary    * Shirlean Kellyobert Nudelman, MD - Assisting  PHYSICIAN ASSISTANT:   ASSISTANTS: Poteat, RN   ANESTHESIA:   general  EBL:  Total I/O In: 1300 [I.V.:1300] Out: 100 [Blood:100]  BLOOD ADMINISTERED:none  DRAINS: none   LOCAL MEDICATIONS USED:  MARCAINE    and LIDOCAINE   SPECIMEN:  No Specimen  DISPOSITION OF SPECIMEN:  N/A  COUNTS:  YES  TOURNIQUET:  * No tourniquets in log *  DICTATION: DICTATION: Patient has severe spinal stenosis at L 34 and L 4-5 with significant bilateral leg pain andweakness and intractable pain. It was elected to take him to surgery for L3 through L5 laminectomy.  His situation is complicated by the presence of an intrathecal dilaudid pump which enters the spinal canal at the al 34 level as well as an implanted spinal cord stimulator.  Procedure: Patient was brought to the operating room and following the smooth and uncomplicated induction of general endotracheal anesthesia he was placed in a prone position on the Wilson frame. Low back was prepped and draped in the usual sterile fashion with betadine scrub and DuraPrep after localizing radiograph was obtained with AP fluoroscopy. Area of planned incision was infiltrated with local lidocaine. Incision was made in the  midline and carried to the lumbodorsal fascia which was incised bilaterally. Subperiosteal dissection was performed exposing what was felt to be L 3 - 5 levels. The intrathecal catheter was identified as it entered the spinal canal at the L 34 interspace and it was protected.   A total laminectomy of L 3 and L4 and L5 was performed with leksell, then a high-speed drill and completed with Kerrison rongeurs and generous foraminotomies were performed to decompress the L 3, L4, L5, and S1 nerves bilaterally. Ligamentum flavum was detached and removed in a piecemeal fashion and the nerve roots were decompressed laterally with removal of the superior aspect of the facet and ligamentum causing nerve root compression. Great care was taken to adequately decompress the thecal sac and nerve roots while protecting the site of catheter entry.  There was no evidence of CSF leakage and the catheter was preserved as was its site of entry.   At this point it was felt that all neural elements were well decompressed. The wound was then irrigated with saline. The lumbodorsal fascia was closed with 0 Vicryl sutures the subcutaneous tissues reapproximated 2-0 Vicryl inverted sutures and the skin edges were reapproximated with 3-0 Vicryl subcuticular stitch. The wound is dressed with Dermabond and an occlusive dressing. Patient was extubated in the operating room and taken to recovery in stable and satisfactory condition having tolerated his operation well counts were correct at the end of the case.    PLAN OF CARE: Admit to inpatient   PATIENT DISPOSITION:  PACU - hemodynamically stable.   Delay start of Pharmacological VTE agent (>24hrs) due to surgical blood loss  or risk of bleeding: yes

## 2015-06-05 NOTE — Interval H&P Note (Signed)
History and Physical Interval Note:  06/05/2015 7:21 AM  Mason Horton  has presented today for surgery, with the diagnosis of Spinal stenosis of lumbar region; Spondylolisthesis, Lumbar region; Low back pain, Unspecified; Radiculopathy, Lumbar region  The various methods of treatment have been discussed with the patient and family. After consideration of risks, benefits and other options for treatment, the patient has consented to  Procedure(s) with comments: L3 to L5 Laminectomy (N/A) - L3 to L5 Laminectomy as a surgical intervention .  The patient's history has been reviewed, patient examined, no change in status, stable for surgery.  I have reviewed the patient's chart and labs.  Questions were answered to the patient's satisfaction.     Lilienne Weins D

## 2015-06-05 NOTE — Anesthesia Postprocedure Evaluation (Signed)
Anesthesia Post Note  Patient: Mason JettyStephen Horton  Procedure(s) Performed: Procedure(s) (LRB): Lumbar three to Lumbar five Laminectomy (N/A)  Anesthesia type: general  Patient location: PACU  Post pain: Pain level controlled  Post assessment: Patient's Cardiovascular Status Stable  Last Vitals:  Filed Vitals:   06/05/15 1125  BP: 124/67  Pulse: 76  Temp: 36.9 C  Resp: 18    Post vital signs: Reviewed and stable  Level of consciousness: sedated  Complications: No apparent anesthesia complications

## 2015-06-05 NOTE — Progress Notes (Signed)
Awake, alert, conversant.  MAEW with good strength.  Patient feels legs are less painful. Doing well.

## 2015-06-05 NOTE — Plan of Care (Signed)
Problem: Consults Goal: Diagnosis - Spinal Surgery Outcome: Completed/Met Date Met:  06/05/15 Lumbar Laminectomy (Complex)     

## 2015-06-05 NOTE — Anesthesia Preprocedure Evaluation (Addendum)
Anesthesia Evaluation  Patient identified by MRN, date of birth, ID band Patient awake    Reviewed: Allergy & Precautions, NPO status , Patient's Chart, lab work & pertinent test results  History of Anesthesia Complications Negative for: history of anesthetic complications  Airway Mallampati: II  TM Distance: >3 FB Neck ROM: Full    Dental  (+) Teeth Intact, Dental Advisory Given   Pulmonary neg pulmonary ROS,    Pulmonary exam normal       Cardiovascular hypertension, Pt. on medications Normal cardiovascular exam    Neuro/Psych Chronic pain: [employer] intrathecal dilaudid pumt Spinal stimulator ? Not functioning negative psych ROS   GI/Hepatic negative GI ROS, Neg liver ROS,   Endo/Other  negative endocrine ROS  Renal/GU negative Renal ROS     Musculoskeletal   Abdominal   Peds  Hematology   Anesthesia Other Findings   Reproductive/Obstetrics                            Anesthesia Physical Anesthesia Plan  ASA: II  Anesthesia Plan: General   Post-op Pain Management:    Induction: Intravenous  Airway Management Planned: Oral ETT  Additional Equipment:   Intra-op Plan:   Post-operative Plan: Extubation in OR  Informed Consent: I have reviewed the patients History and Physical, chart, labs and discussed the procedure including the risks, benefits and alternatives for the proposed anesthesia with the patient or authorized representative who has indicated his/her understanding and acceptance.   Dental advisory given  Plan Discussed with: CRNA, Anesthesiologist and Surgeon  Anesthesia Plan Comments:        Anesthesia Quick Evaluation

## 2015-06-05 NOTE — Anesthesia Procedure Notes (Signed)
Procedure Name: Intubation Date/Time: 06/05/2015 7:34 AM Performed by: Jerilee HohMUMM, Keishla Oyer N Pre-anesthesia Checklist: Patient identified, Emergency Drugs available, Suction available and Patient being monitored Patient Re-evaluated:Patient Re-evaluated prior to inductionOxygen Delivery Method: Circle system utilized Preoxygenation: Pre-oxygenation with 100% oxygen Intubation Type: IV induction Ventilation: Mask ventilation without difficulty Laryngoscope Size: Mac and 4 Grade View: Grade II Tube type: Oral Tube size: 7.5 mm Number of attempts: 1 Airway Equipment and Method: Stylet Placement Confirmation: ETT inserted through vocal cords under direct vision,  positive ETCO2 and breath sounds checked- equal and bilateral Secured at: 22 cm Tube secured with: Tape Dental Injury: Teeth and Oropharynx as per pre-operative assessment

## 2015-06-05 NOTE — Brief Op Note (Signed)
06/05/2015  10:10 AM  PATIENT:  Mason Horton  64 y.o. male  PRE-OPERATIVE DIAGNOSIS:  Spinal stenosis of lumbar region; Spondylolisthesis, Lumbar region; Low back pain, Unspecified; Radiculopathy, Lumbar region with implanted intrathecal dilaudid pump and catheter  POST-OPERATIVE DIAGNOSIS:  Spinal stenosis of lumbar region; Spondylolisthesis, Lumbar region; Low back pain, Unspecified; Radiculopathy, Lumbar region with implanted intrathecal dilaudid pump and catheter  PROCEDURE:  Procedure(s) with comments: Lumbar three to Lumbar five Laminectomy (N/A) - L3 to L5 Laminectomy for spinal stenosis with avoidance of intrathecal catheter  SURGEON:  Surgeon(s) and Role:    * Mercer Stallworth, MD - Primary    * Robert Nudelman, MD - Assisting  PHYSICIAN ASSISTANT:   ASSISTANTS: Poteat, RN   ANESTHESIA:   general  EBL:  Total I/O In: 1300 [I.V.:1300] Out: 100 [Blood:100]  BLOOD ADMINISTERED:none  DRAINS: none   LOCAL MEDICATIONS USED:  MARCAINE    and LIDOCAINE   SPECIMEN:  No Specimen  DISPOSITION OF SPECIMEN:  N/A  COUNTS:  YES  TOURNIQUET:  * No tourniquets in log *  DICTATION: DICTATION: Patient has severe spinal stenosis at L 34 and L 4-5 with significant bilateral leg pain andweakness and intractable pain. It was elected to take him to surgery for L3 through L5 laminectomy.  His situation is complicated by the presence of an intrathecal dilaudid pump which enters the spinal canal at the al 34 level as well as an implanted spinal cord stimulator.  Procedure: Patient was brought to the operating room and following the smooth and uncomplicated induction of general endotracheal anesthesia he was placed in a prone position on the Wilson frame. Low back was prepped and draped in the usual sterile fashion with betadine scrub and DuraPrep after localizing radiograph was obtained with AP fluoroscopy. Area of planned incision was infiltrated with local lidocaine. Incision was made in the  midline and carried to the lumbodorsal fascia which was incised bilaterally. Subperiosteal dissection was performed exposing what was felt to be L 3 - 5 levels. The intrathecal catheter was identified as it entered the spinal canal at the L 34 interspace and it was protected.   A total laminectomy of L 3 and L4 and L5 was performed with leksell, then a high-speed drill and completed with Kerrison rongeurs and generous foraminotomies were performed to decompress the L 3, L4, L5, and S1 nerves bilaterally. Ligamentum flavum was detached and removed in a piecemeal fashion and the nerve roots were decompressed laterally with removal of the superior aspect of the facet and ligamentum causing nerve root compression. Great care was taken to adequately decompress the thecal sac and nerve roots while protecting the site of catheter entry.  There was no evidence of CSF leakage and the catheter was preserved as was its site of entry.   At this point it was felt that all neural elements were well decompressed. The wound was then irrigated with saline. The lumbodorsal fascia was closed with 0 Vicryl sutures the subcutaneous tissues reapproximated 2-0 Vicryl inverted sutures and the skin edges were reapproximated with 3-0 Vicryl subcuticular stitch. The wound is dressed with Dermabond and an occlusive dressing. Patient was extubated in the operating room and taken to recovery in stable and satisfactory condition having tolerated his operation well counts were correct at the end of the case.    PLAN OF CARE: Admit to inpatient   PATIENT DISPOSITION:  PACU - hemodynamically stable.   Delay start of Pharmacological VTE agent (>24hrs) due to surgical blood loss   or risk of bleeding: yes

## 2015-06-05 NOTE — Progress Notes (Signed)
Patient awake, alert.  Says leg pain significantly improved.  Strength full.  No headache.  Doing well.

## 2015-06-06 MED ORDER — OXYCODONE-ACETAMINOPHEN 5-325 MG PO TABS: 1.0000 | ORAL_TABLET | ORAL | Status: AC | PRN

## 2015-06-06 MED ORDER — METHOCARBAMOL 500 MG PO TABS: 500.0000 mg | ORAL_TABLET | Freq: Four times a day (QID) | ORAL | Status: AC | PRN

## 2015-06-06 NOTE — Discharge Summary (Signed)
Physician Discharge Summary  Patient ID: Mason Horton MRN: [id number] DOB/AGE: August 20, 1951 64 y.o.  Admit date: 06/05/2015 Discharge date: 06/06/2015  Admission Diagnoses:  Spinal stenosis of lumbar region; Spondylolisthesis, Lumbar region; Low back pain, Unspecified; Radiculopathy, Lumbar region with implanted intrathecal dilaudid pump and catheter  Discharge Diagnoses:  Spinal stenosis of lumbar region; Spondylolisthesis, Lumbar region; Low back pain, Unspecified; Radiculopathy, Lumbar region with implanted intrathecal dilaudid pump and catheter  Active Problems:   Lumbar stenosis with neurogenic claudication   Discharged Condition: good  Hospital Course: Patient admitted by Dr. Venetia MaxonStern, underwent a L3-L5 decompressive lumbar laminectomy. Postoperatively he is done well. He is up and ambulating in the halls. He is being discharged home with instructions regarding wound care and activities. He is to return for follow-up with Dr. Venetia MaxonStern in 3 weeks.  Discharge Exam: Blood pressure 121/75, pulse 83, temperature 99.1 F (37.3 C), temperature source Oral, resp. rate 18, height 6' (1.829 m), weight 94.348 kg (208 lb), SpO2 99 %.  Disposition: Home    Medication List    TAKE these medications        citalopram 20 MG tablet  Commonly known as:  CELEXA  Take 20 mg by mouth daily.     Iron Tabs  Take 1 tablet by mouth daily.     lisinopril 10 MG tablet  Commonly known as:  PRINIVIL,ZESTRIL  Take 10 mg by mouth daily.     methocarbamol 500 MG tablet  Commonly known as:  ROBAXIN  Take 1 tablet (500 mg total) by mouth every 6 (six) hours as needed for muscle spasms.     oxyCODONE-acetaminophen 5-325 MG per tablet  Commonly known as:  PERCOCET/ROXICET  Take 1-2 tablets by mouth every 4 (four) hours as needed (pain).     PRESCRIPTION MEDICATION  Dilaudid Pump 5 mg/ mL continuous 0.6291 mg per day     VITAMIN B-1 PO  Take 1 tablet by mouth daily.           Follow-up Information     Follow up with Dorian HeckleSTERN,JOSEPH D, MD.   Specialty:  Neurosurgery   Contact information:   [address] [place] [id number] [phone]       Signed: Hewitt ShortsUDELMAN,ROBERT W 06/06/2015, 8:55 AM

## 2015-06-06 NOTE — Evaluation (Addendum)
Occupational Therapy Evaluation Patient Details Name: Mason Horton MRN: [id number] DOB: 1951-08-01 Today's Date: 06/06/2015    History of Present Illness Patient is a 64 y.o. male s/p L3-4 laminectomy. PMH includes HTN, kidney stones.    Clinical Impression   Pt s/p above. Education provided to pt and his spouse. Pt planning to leave today, and feel he is safe to d/c home with spouse available to assist.    Follow Up Recommendations  No OT follow up;Supervision - Intermittent    Equipment Recommendations  None recommended by OT (AE if wanted)   Recommendations for Other Services       Precautions / Restrictions Precautions Precautions: Back Precaution Booklet Issued: Yes (comment) Precaution Comments: Reviewed back precautions.  Restrictions Weight Bearing Restrictions: No      Mobility Bed Mobility               General bed mobility comments: OT demonstrated/educated.  Transfers Overall transfer level: Needs assistance   Transfers: Sit to/from Stand Sit to Stand: Supervision         General transfer comment: cue for technique.    Balance  No LOB in session.                                           ADL Overall ADL's : Needs assistance/impaired                 Upper Body Dressing : Set up;Supervision/safety;Sitting   Lower Body Dressing: Minimal assistance;Sit to/from stand   Toilet Transfer: Supervision/safety;Ambulation (chair)           Functional mobility during ADLs: Supervision/safety General ADL Comments: Educated on LB ADL technique and AE. Feel pt will have difficulty reaching down to don socks based on how far he was reaching to LEs in session with legs crossed over knees. Discussed incorporating precautions into functional activities. Educated on safety such as sitting on chair for LB bathing and recommended spouse be with him for shower transfer and bathing. Educated on what pt could use for toilet aide if  needed and suggested wipes. Educated on positioning of pillows. Reviewed technique for managing steps. Spouse asked about pt using cane-OT discussed.     Vision     Perception     Praxis      Pertinent Vitals/Pain Pain Assessment: 0-10 Pain Score: 7  Pain Location: back Pain Descriptors / Indicators: Aching Pain Intervention(s): Monitored during session;Repositioned;Patient requesting pain meds-RN notified     Hand Dominance Right   Extremity/Trunk Assessment Upper Extremity Assessment Upper Extremity Assessment: Overall WFL for tasks assessed   Lower Extremity Assessment Lower Extremity Assessment: Defer to PT evaluation       Communication Communication Communication: No difficulties   Cognition Arousal/Alertness: Awake/alert Behavior During Therapy: WFL for tasks assessed/performed Overall Cognitive Status: Within Functional Limits for tasks assessed                     General Comments       Exercises       Shoulder Instructions      Home Living Family/patient expects to be discharged to:: Private residence Living Arrangements: Spouse/significant other Available Help at Discharge: Family Type of Home: House Home Access: Level entry     Home Layout: Two level;Able to live on main level with bedroom/bathroom     Bathroom Shower/Tub: Walk-in shower;Tub only  Bathroom Toilet: Handicapped height (sink close)     Home Equipment:  (believe he has a cane and a handheld shower hose)          Prior Functioning/Environment Level of Independence: Independent             OT Diagnosis: Acute pain   OT Problem List:     OT Treatment/Interventions:      OT Goals(Current goals can be found in the care plan section)    OT Frequency:     Barriers to D/C:            Co-evaluation              End of Session Nurse Communication: Other (comment) (asked about pain meds; OT signing off; no DME needs)  Activity Tolerance: Patient  tolerated treatment well Patient left: in chair;with family/visitor present   Time: 0810-0830 OT Time Calculation (min): 20 min Charges:  OT General Charges $OT Visit: 1 Procedure OT Evaluation $Initial OT Evaluation Tier I: 1 Procedure G-CodesEarlie Horton:    Mason Horton L OTR/L [id number] 06/06/2015, 9:29 AM

## 2015-06-06 NOTE — Progress Notes (Signed)
Patient alert and oriented, mae's well, voiding adequate amount of urine, swallowing without difficulty, c/o moderate pain and meds given prior to discharged. Patient discharged home with family. Script and discharged instructions given to patient. Patient and family stated understanding of instructions given.  

## 2015-06-09 ENCOUNTER — Encounter (HOSPITAL_COMMUNITY): Payer: Self-pay | Admitting: Neurosurgery

## 2017-04-25 IMAGING — RF DG LUMBAR SPINE 1V
1 series · 1 of 1 positions shown · non-contrast
Comparison: Radiographs dated 05/18/2015

CLINICAL DATA: Lumbar spinal stenosis.

EXAM:
DG C-ARM 61-120 MIN; LUMBAR SPINE - 1 VIEW

[Series 1: run · 1 of 1 slices shown]
[im 1/1]
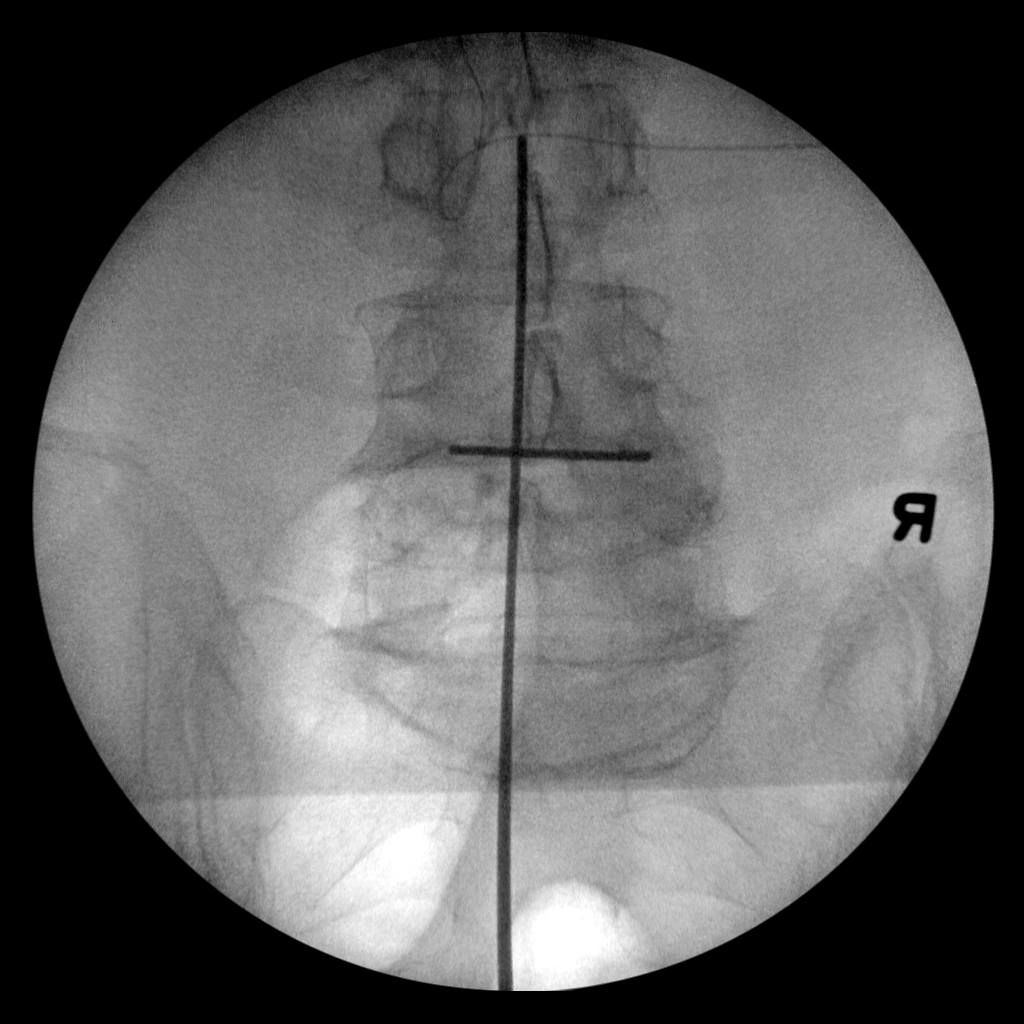

[1 of 1 positions shown; findings below may reference images not displayed]

FINDINGS: Single AP C-arm image demonstrates metallic marker extending
horizontally across the level of the L4-5 disc.
IMPRESSION: Horizontal component of the marker is at L4-5.
